# Patient Record
Sex: Female | Born: 1965 | Race: White | Hispanic: No | Marital: Married | Smoking: Former smoker
Health system: Southern US, Community
[De-identification: ages and names within clinical notes are randomized; demographics above are authoritative.]

## PROBLEM LIST (undated history)

## (undated) HISTORY — PX: CARPAL TUNNEL RELEASE: SHX101

## (undated) HISTORY — PX: OTHER SURGICAL HISTORY: SHX169

## (undated) HISTORY — PX: ABDOMINAL HYSTERECTOMY: SHX81

---

## 1998-11-06 ENCOUNTER — Other Ambulatory Visit: Admission: RE | Admit: 1998-11-06 | Discharge: 1998-11-06 | Payer: Self-pay | Admitting: Obstetrics and Gynecology

## 2000-10-24 ENCOUNTER — Other Ambulatory Visit: Admission: RE | Admit: 2000-10-24 | Discharge: 2000-10-24 | Payer: Self-pay | Admitting: Obstetrics and Gynecology

## 2005-11-16 ENCOUNTER — Other Ambulatory Visit: Admission: RE | Admit: 2005-11-16 | Discharge: 2005-11-16 | Payer: Self-pay | Admitting: Obstetrics and Gynecology

## 2011-11-05 ENCOUNTER — Other Ambulatory Visit: Payer: Self-pay | Admitting: Obstetrics and Gynecology

## 2011-11-05 DIAGNOSIS — R928 Other abnormal and inconclusive findings on diagnostic imaging of breast: Secondary | ICD-10-CM

## 2011-11-12 ENCOUNTER — Ambulatory Visit
Admission: RE | Admit: 2011-11-12 | Discharge: 2011-11-12 | Disposition: A | Discharge status: Home / Self Care | Payer: BC Managed Care – PPO | Source: Ambulatory Visit | Attending: Obstetrics and Gynecology | Admitting: Obstetrics and Gynecology

## 2011-11-12 ENCOUNTER — Other Ambulatory Visit: Payer: Self-pay | Admitting: Obstetrics and Gynecology

## 2011-11-12 DIAGNOSIS — R928 Other abnormal and inconclusive findings on diagnostic imaging of breast: Secondary | ICD-10-CM

## 2013-05-10 ENCOUNTER — Other Ambulatory Visit: Payer: Self-pay

## 2013-05-11 ENCOUNTER — Other Ambulatory Visit: Payer: Self-pay

## 2013-05-11 DIAGNOSIS — Z1231 Encounter for screening mammogram for malignant neoplasm of breast: Secondary | ICD-10-CM

## 2013-05-28 ENCOUNTER — Ambulatory Visit
Admission: RE | Admit: 2013-05-28 | Discharge: 2013-05-28 | Disposition: A | Discharge status: Home / Self Care | Payer: BC Managed Care – PPO | Source: Ambulatory Visit

## 2013-05-28 DIAGNOSIS — Z1231 Encounter for screening mammogram for malignant neoplasm of breast: Secondary | ICD-10-CM

## 2018-07-14 ENCOUNTER — Other Ambulatory Visit: Payer: Self-pay | Admitting: Family Medicine

## 2018-07-14 DIAGNOSIS — Z1231 Encounter for screening mammogram for malignant neoplasm of breast: Secondary | ICD-10-CM

## 2018-07-27 ENCOUNTER — Encounter: Payer: Self-pay | Admitting: *Deleted

## 2018-08-04 ENCOUNTER — Ambulatory Visit
Admission: RE | Admit: 2018-08-04 | Discharge: 2018-08-04 | Disposition: A | Discharge status: Home / Self Care | Payer: BC Managed Care – PPO | Source: Ambulatory Visit | Attending: Family Medicine | Admitting: Family Medicine

## 2018-08-04 DIAGNOSIS — Z1231 Encounter for screening mammogram for malignant neoplasm of breast: Secondary | ICD-10-CM

## 2019-08-09 ENCOUNTER — Other Ambulatory Visit: Payer: Self-pay | Admitting: Family Medicine

## 2019-08-09 DIAGNOSIS — R1084 Generalized abdominal pain: Secondary | ICD-10-CM

## 2019-08-15 ENCOUNTER — Other Ambulatory Visit: Payer: Self-pay

## 2019-08-15 ENCOUNTER — Ambulatory Visit
Admission: RE | Admit: 2019-08-15 | Discharge: 2019-08-15 | Disposition: A | Discharge status: Home / Self Care | Payer: BC Managed Care – PPO | Source: Ambulatory Visit | Attending: Family Medicine | Admitting: Family Medicine

## 2019-08-15 DIAGNOSIS — R1084 Generalized abdominal pain: Secondary | ICD-10-CM | POA: Diagnosis not present

## 2019-08-21 ENCOUNTER — Other Ambulatory Visit: Payer: Self-pay

## 2019-08-21 ENCOUNTER — Ambulatory Visit (INDEPENDENT_AMBULATORY_CARE_PROVIDER_SITE_OTHER): Payer: BC Managed Care – PPO | Admitting: Cardiology

## 2019-08-21 ENCOUNTER — Encounter: Payer: Self-pay | Admitting: Cardiology

## 2019-08-21 VITALS — BP 120/82 | HR 66 | Temp 97.0°F | Ht 66.0 in | Wt 171.8 lb

## 2019-08-21 DIAGNOSIS — I77819 Aortic ectasia, unspecified site: Secondary | ICD-10-CM

## 2019-08-21 DIAGNOSIS — Z01810 Encounter for preprocedural cardiovascular examination: Secondary | ICD-10-CM

## 2019-08-21 NOTE — Patient Instructions (Addendum)
Medication Instructions:  Your physician recommends that you continue on your current medications as directed. Please refer to the Current Medication list given to you today.  *If you need a refill on your cardiac medications before your next appointment, please call your pharmacy*  Lab Work: Your physician recommends that you return for lab work in: prior to the [place].   - Please go to the [facility]. You will check in at the front desk to the right as you walk into the atrium. Valet Parking is offered if needed. - No appointment needed. You may go any day between 7 am and 6 pm.  If you have labs (blood work) drawn today and your tests are completely normal, you will receive your results only by: Marland Kitchen MyChart Message (if you have MyChart) OR . A paper copy in the mail If you have any lab test that is abnormal or we need to change your treatment, we will call you to review the results.  Testing/Procedures: Non-Cardiac CT Angiography (CTA) of chest, abdomen, and pelvis, is a special type of CT scan that uses a computer to produce multi-dimensional views of major blood vessels throughout the body. In CT angiography, a contrast material is injected through an IV to help visualize the blood vessels DATE/TIME: _____Thursday, August 30, 2019 arrive at 12:45 pm ____________ LOCATION:  ____[facility] _____ INSTRUCTIONS: __Liquids only 4 hours prior to procedure______    Follow-Up: At [facility], you and your health needs are our priority.  As part of our continuing mission to provide you with exceptional heart care, we have created designated Provider Care Teams.  These Care Teams include your primary Cardiologist (physician) and Advanced Practice Providers (APPs -  Physician Assistants and Nurse Practitioners) who all work together to provide you with the care you need, when you need it.  Your next appointment:   1-2 months.  The format for your next appointment:   In  Person  Provider:    You may see Kate Sable, MD or one of the following Advanced Practice Providers on your designated Care Team:    Murray Hodgkins, NP  Christell Faith, PA-C  Marrianne Mood, PA-C

## 2019-08-21 NOTE — Progress Notes (Signed)
Cardiology Office Note:    Date:  08/21/2019   ID:  Ashley Crosby, DOB May 30, 1966, MRN [id number]  PCP:  Ranae Plumber, [place]  Cardiologist:  Kate Sable, MD  Electrophysiologist:  None   Referring MD: Ranae Plumber, [place]   Chief Complaint  Patient presents with  . New Patient (Initial Visit)    Dilated abdominal aorta; Patient reports intermittent bilat hand swelling; Meds reviewed verbally with patient    History of Present Illness:    Ashley Crosby is a 53 y.o. female with a hx of dilated abdominal aorta, smoker x34+ years who presents due to aortic disease.  Patient went to see her primary care provider due to abdominal pain.  An abdominal ultrasound was ordered to evaluate gallstone disease.  The abdominal ultrasound revealed an ectatic abdominal aorta measuring 2.5 cm.  Patient denies any history of aortic disease in herself or her family.  She has smoked cigarettes for 34 years, tried to quit but that did not work so she started doing e-cigarettes.  She states her breathing and frequent bronchitis has improved on the e-cigarettes.  She denies chest pain or shortness of breath at rest or with ambulation.  History reviewed. No pertinent past medical history.  Past Surgical History:  Procedure Laterality Date  . ABDOMINAL HYSTERECTOMY    . CARPAL TUNNEL RELEASE Right   . foot surgery Right     Current Medications: Current Meds  Medication Sig  . traMADol (ULTRAM) 50 MG tablet Take by mouth every 6 (six) hours as needed.     Allergies:   Patient has no known allergies.   Social History   Socioeconomic History  . Marital status: Married    Spouse name: Not on file  . Number of children: Not on file  . Years of education: Not on file  . Highest education level: Not on file  Occupational History  . Not on file  Social Needs  . Financial resource strain: Not on file  . Food insecurity    Worry: Not on file    Inability: Not on file  . Transportation needs   Medical: Not on file    Non-medical: Not on file  Tobacco Use  . Smoking status: Former Smoker    Years: 30.00    Types: Cigarettes  . Smokeless tobacco: Never Used  Substance and Sexual Activity  . Alcohol use: Not Currently  . Drug use: Never  . Sexual activity: Not on file  Lifestyle  . Physical activity    Days per week: Not on file    Minutes per session: Not on file  . Stress: Not on file  Relationships  . Social Herbalist on phone: Not on file    Gets together: Not on file    Attends religious service: Not on file    Active member of club or organization: Not on file    Attends meetings of clubs or organizations: Not on file    Relationship status: Not on file  Other Topics Concern  . Not on file  Social History Narrative  . Not on file     Family History: The patient's family history is not on file. She was adopted.  ROS:   Please see the history of present illness.     All other systems reviewed and are negative.  EKGs/Labs/Other Studies Reviewed:    The following studies were reviewed today:  Abdominal ultrasound dated 08/15/2019 IMPRESSION: 1. Negative for gallstones. Distal common duct diameter  borderline to slightly enlarged measuring 6.9 mm maximum. Suggest correlation with LFTs. If ductal obstruction is suspected, further evaluation with MRCP could be obtained. 2. Small cyst in the liver and kidney 3. Aortic diameter of 2.5 cm. Ectatic abdominal aorta at risk for aneurysm development. Recommend followup by ultrasound in 5 years. This recommendation follows ACR consensus guidelines: White Paper of the ACR Incidental Findings Committee II on Vascular Findings. J Am Coll Radiol 2013; 10:789-794.  EKG:  EKG is  ordered today.  The ekg ordered today demonstrates normal sinus rhythm, normal ECG.  Recent Labs: No results found for requested labs within last 8760 hours.  Recent Lipid Panel No results found for: CHOL, TRIG, HDL, CHOLHDL,  VLDL, LDLCALC, LDLDIRECT  Physical Exam:    VS:  BP 120/82 (BP Location: Right Arm, Patient Position: Sitting, Cuff Size: Normal)   Pulse 66   Temp (!) 97 F (36.1 C)   Ht 5\' 6"  (1.676 m)   Wt 171 lb 12 oz (77.9 kg)   SpO2 99%   BMI 27.72 kg/m     Wt Readings from Last 3 Encounters:  08/21/19 171 lb 12 oz (77.9 kg)     GEN:  Well nourished, well developed in no acute distress HEENT: Normal NECK: No JVD; No carotid bruits LYMPHATICS: No lymphadenopathy CARDIAC: RRR, no murmurs, rubs, gallops RESPIRATORY:  Clear to auscultation without rales, wheezing or rhonchi  ABDOMEN: Soft, non-tender, non-distended MUSCULOSKELETAL:  No edema; No deformity  SKIN: Warm and dry NEUROLOGIC:  Alert and oriented x 3 PSYCHIATRIC:  Normal affect   ASSESSMENT:   Patient with risk factors of smoking found to have an ectatic abdominal aorta.  Risk factors, pathology for aortic dilatation explained to patient.  Management options also explained.  1. Aortic dilatation (HCC)   2. smoking    PLAN:    1. We will get a CT angio of her chest abdomen to evaluate the entire aorta to see if there is dilatation in any of the other aortic segments. 2. Smoking cessation advised.  Total encounter time more than 45 minutes  Greater than 50% was spent in counseling and coordination of care with the patient   Medication Adjustments/Labs and Tests Ordered: Current medicines are reviewed at length with the patient today.  Concerns regarding medicines are outlined above.  Orders Placed This Encounter  Procedures  . CT ANGIO CHEST AORTA W &/OR WO CONTRAST  . CT ANGIO ABDOMEN PELVIS  W &/OR WO CONTRAST  . Basic metabolic panel  . EKG 12-Lead   No orders of the defined types were placed in this encounter.   There are no Patient Instructions on file for this visit.   Signed, Kate Sable, MD  08/21/2019 4:48 PM    Standard

## 2019-08-24 ENCOUNTER — Other Ambulatory Visit: Payer: Self-pay

## 2019-08-24 ENCOUNTER — Other Ambulatory Visit
Admission: RE | Admit: 2019-08-24 | Discharge: 2019-08-24 | Disposition: A | Discharge status: Home / Self Care | Payer: BC Managed Care – PPO | Source: Ambulatory Visit | Attending: Cardiology | Admitting: Cardiology

## 2019-08-24 DIAGNOSIS — I77819 Aortic ectasia, unspecified site: Secondary | ICD-10-CM

## 2019-08-24 DIAGNOSIS — Z01810 Encounter for preprocedural cardiovascular examination: Secondary | ICD-10-CM

## 2019-08-24 LAB — BASIC METABOLIC PANEL
Anion gap: 8 (ref 5–15)
BUN: 15 mg/dL (ref 6–20)
CO2: 28 mmol/L (ref 22–32)
Calcium: 9.2 mg/dL (ref 8.9–10.3)
Chloride: 103 mmol/L (ref 98–111)
Creatinine, Ser: 0.66 mg/dL (ref 0.44–1.00)
GFR calc Af Amer: >60 mL/min (ref >60)
GFR calc non Af Amer: >60 mL/min (ref >60)
Glucose, Bld: 87 mg/dL (ref 70–99)
Potassium: 4.8 mmol/L (ref 3.5–5.1)
Sodium: 139 mmol/L (ref 135–145)

## 2019-08-30 ENCOUNTER — Other Ambulatory Visit: Payer: Self-pay

## 2019-08-30 ENCOUNTER — Ambulatory Visit
Admission: RE | Admit: 2019-08-30 | Discharge: 2019-08-30 | Disposition: A | Discharge status: Home / Self Care | Payer: BC Managed Care – PPO | Source: Ambulatory Visit | Attending: Cardiology | Admitting: Cardiology

## 2019-08-30 DIAGNOSIS — I77819 Aortic ectasia, unspecified site: Secondary | ICD-10-CM

## 2019-08-30 MED ORDER — IOHEXOL 350 MG/ML SOLN
100.0000 mL | Freq: Once | INTRAVENOUS | Status: AC | PRN
  Administered 2019-08-30: 100 mL via INTRAVENOUS

## 2019-08-31 ENCOUNTER — Other Ambulatory Visit: Payer: Self-pay | Admitting: Family Medicine

## 2019-08-31 DIAGNOSIS — N63 Unspecified lump in unspecified breast: Secondary | ICD-10-CM

## 2019-09-11 ENCOUNTER — Ambulatory Visit
Admission: RE | Admit: 2019-09-11 | Discharge: 2019-09-11 | Disposition: A | Discharge status: Home / Self Care | Payer: BC Managed Care – PPO | Source: Ambulatory Visit | Attending: Family Medicine | Admitting: Family Medicine

## 2019-09-11 ENCOUNTER — Other Ambulatory Visit: Payer: Self-pay

## 2019-09-11 ENCOUNTER — Ambulatory Visit: Payer: BC Managed Care – PPO

## 2019-09-11 DIAGNOSIS — I8290 Acute embolism and thrombosis of unspecified vein: Secondary | ICD-10-CM | POA: Insufficient documentation

## 2019-09-11 DIAGNOSIS — N63 Unspecified lump in unspecified breast: Secondary | ICD-10-CM

## 2019-09-12 ENCOUNTER — Encounter: Payer: Self-pay | Admitting: Gastroenterology

## 2019-09-12 ENCOUNTER — Ambulatory Visit (INDEPENDENT_AMBULATORY_CARE_PROVIDER_SITE_OTHER): Payer: BC Managed Care – PPO | Admitting: Gastroenterology

## 2019-09-12 ENCOUNTER — Other Ambulatory Visit: Payer: Self-pay

## 2019-09-12 VITALS — BP 126/81 | HR 76 | Temp 98.1°F | Wt 168.5 lb

## 2019-09-12 DIAGNOSIS — R109 Unspecified abdominal pain: Secondary | ICD-10-CM

## 2019-09-12 DIAGNOSIS — K831 Obstruction of bile duct: Secondary | ICD-10-CM | POA: Diagnosis not present

## 2019-09-12 NOTE — Progress Notes (Signed)
Ashley Crosby [address]  [place]  [place], [place] [address]  Main: [phone]  Fax: [phone]   Gastroenterology Consultation  Referring Provider:     Marguerita Merles, MD Primary Care Physician:  Ranae Plumber, [place] Reason for Consultation:     Abdominal pain        HPI:    Chief Complaint  Patient presents with   New Patient (Initial Visit)   Abdominal Pain    Patient has RUQ pain and it radiates to her back. Patient states the pain comes and goes. Patient has had nausea constipation and diarrhea     Ashley Crosby is a 53 y.o. y/o female referred for consultation & management  by Dr. Ranae Plumber, [place].  Patient reports right upper quadrant abdominal pain ongoing for 1 to 2 months.  Constant, does not worsen with meals.  5/10, not associated with nausea or vomiting.  No weight loss.  Has had some changes in bowel movements, but no blood in stool.  No diarrhea.  No melena.  No prior EGD or colonoscopy.  No family history of colon cancer.  The only lab that I have available is a recent BMP that was normal.  Right upper quadrant ultrasound negative for gallstones.  Reported distal CBD, borderline enlarged to 6.9 mm.  Is undergoing work-up with cardiology for aortic dilation.  History reviewed. No pertinent past medical history.  Past Surgical History:  Procedure Laterality Date   ABDOMINAL HYSTERECTOMY     CARPAL TUNNEL RELEASE Right    foot surgery Right     Prior to Admission medications   Medication Sig Start Date End Date Taking? Authorizing Provider  cyclobenzaprine (FLEXERIL) 5 MG tablet  09/07/19  Yes [provider]    Family History  Adopted: Yes     Social History   Tobacco Use   Smoking status: Former Smoker    Years: 30.00    Types: Cigarettes   Smokeless tobacco: Never Used  Substance Use Topics   Alcohol use: Not Currently   Drug use: Never    Allergies as of 09/12/2019   (No Known Allergies)     Review of Systems:    All systems reviewed and negative except where noted in HPI.   Physical Exam:  BP 126/81 (BP Location: Left Arm, Patient Position: Sitting, Cuff Size: Normal)    Pulse 76    Temp 98.1 F (36.7 C) (Oral)    Wt 168 lb 8 oz (76.4 kg)    BMI 27.20 kg/m  No LMP recorded. Psych:  Alert and cooperative. Normal mood and affect. General:   Alert,  Well-developed, well-nourished, pleasant and cooperative in NAD Head:  Normocephalic and atraumatic. Eyes:  Sclera clear, no icterus.   Conjunctiva pink. Ears:  Normal auditory acuity. Nose:  No deformity, discharge, or lesions. Mouth:  No deformity or lesions,oropharynx pink & moist. Neck:  Supple; no masses or thyromegaly. Abdomen:  Normal bowel sounds.  No bruits.  Soft, non-tender and non-distended without masses, hepatosplenomegaly or hernias noted.  No guarding or rebound tenderness.    Msk:  Symmetrical without gross deformities. Good, equal movement & strength bilaterally. Pulses:  Normal pulses noted. Extremities:  No clubbing or edema.  No cyanosis. Neurologic:  Alert and oriented x3;  grossly normal neurologically. Skin:  Intact without significant lesions or rashes. No jaundice. Lymph Nodes:  No significant cervical adenopathy. Psych:  Alert and cooperative. Normal mood and affect.   Labs: CBC No results  found for: WBC, RBC, HGB, HCT, PLT, MCV, MCH, MCHC, RDW, LYMPHSABS, MONOABS, EOSABS, BASOSABS CMP     Component Value Date/Time   NA 139 08/24/2019 1401   K 4.8 08/24/2019 1401   CL 103 08/24/2019 1401   CO2 28 08/24/2019 1401   GLUCOSE 87 08/24/2019 1401   BUN 15 08/24/2019 1401   CREATININE 0.66 08/24/2019 1401   CALCIUM 9.2 08/24/2019 1401   GFRNONAA >60 08/24/2019 1401   GFRAA >60 08/24/2019 1401    Imaging Studies: US Abdomen Complete  Result Date: 08/15/2019 CLINICAL DATA:  Right-sided abdominal pain EXAM: ABDOMEN ULTRASOUND COMPLETE COMPARISON:  None. FINDINGS: Gallbladder: No gallstones  or wall thickening visualized. No sonographic Murphy sign noted by sonographer. Common bile duct: Diameter: Measures up to 6.9 mm distally. Liver: Small cysts in the posterior right hepatic lobe measuring 9 mm. Within normal limits in parenchymal echogenicity. Portal vein is patent on color Doppler imaging with normal direction of blood flow towards the liver. IVC: No abnormality visualized. Pancreas: Visualized portion unremarkable. Spleen: Size and appearance within normal limits. Right Kidney: Length: 10.6 cm. Echogenicity within normal limits. No mass or hydronephrosis visualized. Left Kidney: Length: 10.4 cm. Cortical echogenicity within normal limits. No hydronephrosis. Cyst in the lower pole measuring 1.2 cm. Abdominal aorta: No aneurysm visualized. Maximum AP diameter of 2.5 cm Other findings: None. IMPRESSION: 1. Negative for gallstones. Distal common duct diameter borderline to slightly enlarged measuring 6.9 mm maximum. Suggest correlation with LFTs. If ductal obstruction is suspected, further evaluation with MRCP could be obtained. 2. Small cyst in the liver and kidney 3. Aortic diameter of 2.5 cm. Ectatic abdominal aorta at risk for aneurysm development. Recommend followup by ultrasound in 5 years. This recommendation follows ACR consensus guidelines: White Paper of the ACR Incidental Findings Committee II on Vascular Findings. J Am Coll Radiol 2013; 10:789-794. Electronically Signed   By: Donavan Foil M.D.   On: 08/15/2019 13:38   Mm Diag Breast Tomo Bilateral  Result Date: 09/11/2019 CLINICAL DATA:  53 year old female for further evaluation of possible RETROAREOLAR LEFT breast mass identified on recent CT. EXAM: DIGITAL DIAGNOSTIC BILATERAL MAMMOGRAM WITH CAD AND TOMO COMPARISON:  08/30/2019 CTA chest and mammograms dated 08/04/2018 and 05/28/2013. ACR Breast Density Category c: The breast tissue is heterogeneously dense, which may obscure small masses. FINDINGS: 2D/3D full field views of both  breasts demonstrate a 2 cm circumscribed oval mass within the RETROAREOLAR LEFT breast corresponding to the CT finding. This mass is unchanged from 2014 and considered benign. No suspicious mass, distortion or worrisome calcifications within either breast noted. Mammographic images were processed with CAD. IMPRESSION: 1. Benign mass in the RETROAREOLAR LEFT breast corresponding to the CT finding, unchanged from 2014 mammogram. 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 2. No mammographic evidence of breast malignancy. RECOMMENDATION: Bilateral screening mammogram in 1 year. I have discussed the findings and recommendations with the patient. If applicable, a reminder letter will be sent to the patient regarding the next appointment. BI-RADS CATEGORY  2: Benign. Electronically Signed   By: Margarette Canada M.D.   On: 09/11/2019 15:03   Ct Angio Chest Aorta W &/or Wo Contrast  Result Date: 08/30/2019 CLINICAL DATA:  Evaluate for aortic dilatation. EXAM: CT ANGIOGRAPHY CHEST, ABDOMEN AND PELVIS TECHNIQUE: Multidetector CT imaging through the chest, abdomen and pelvis was performed using the standard protocol during bolus administration of intravenous contrast. Multiplanar reconstructed images and MIPs were obtained and reviewed to evaluate the vascular anatomy. CONTRAST:  149mL OMNIPAQUE IOHEXOL 350  MG/ML SOLN COMPARISON:  Ultrasound 08/07/2019 FINDINGS: CTA CHEST FINDINGS Cardiovascular: Normal caliber of the ascending thoracic aorta measuring up to 2.9 cm. Typical three-vessel arch anatomy. Great vessels are patent. Proximal vertebral arteries are patent. Proximal descending thoracic aorta measures 2.2 cm. Negative for an aortic dissection. Distal descending thoracic aorta measures 2.1 cm. Main pulmonary arteries are patent. Heart size is normal. No significant pericardial fluid. Mediastinum/Nodes: Question a 1.1 cm low-density cyst nodule in the superior left thyroid lobe. No significant mediastinal or hilar  lymphadenopathy. No significant axillary lymph node enlargement. Focal nodularity in the breast tissue in the retroareolar region. This nodular tissue measures 2.0 x 1.2 x 1.8 cm. Lungs/Pleura: Scarring at both lung apices. Trachea and mainstem bronchi are patent. No large pleural effusions. Focal thickening along the right major fissure on sequence 505, image 123 measures 3 mm. No significant airspace disease or consolidation in the lungs. Musculoskeletal: No acute bone abnormality. Review of the MIP images confirms the above findings. CTA ABDOMEN AND PELVIS FINDINGS VASCULAR Aorta: Mild atherosclerotic disease in the infrarenal abdominal aorta. Negative for aneurysm or dissection. Aorta at the hiatus measures up to 2.2 cm. Celiac: Patent without evidence of aneurysm, dissection, vasculitis or significant stenosis. Question mild stenosis in the proximal aspect. SMA: Patent without evidence of aneurysm, dissection, vasculitis or significant stenosis. Renals: Both renal arteries are patent without evidence of aneurysm, dissection, vasculitis, fibromuscular dysplasia or significant stenosis. Right renal artery has an early bifurcation. IMA: IMA is patent. Inflow: Patent without evidence of aneurysm, dissection, vasculitis or significant stenosis. Veins: Normal appearance of the IVC and portal venous system on the delayed images. Review of the MIP images confirms the above findings. NON-VASCULAR Hepatobiliary: Normal appearance of the gallbladder. 9 mm low-density along the posterior aspect of the right hepatic lobe on sequence 54, image 163 probably represents a cyst. No biliary dilatation. Pancreas: Scattered areas of focal fat involving the pancreas. No pancreatic inflammation or duct dilatation. Spleen: Normal in size without focal abnormality. Adrenals/Urinary Tract: Adrenal tissue is within normal limits. 1.2 cm cyst in left kidney lower pole. No suspicious renal lesions. No hydronephrosis. Urinary bladder is  unremarkable. Stomach/Bowel: Stomach is within normal limits. Appendix appears normal. No evidence of bowel wall thickening, distention, or inflammatory changes. Lymphatic: No abdominopelvic lymphadenopathy. Reproductive: Status post hysterectomy. No adnexal masses. Other: Negative for ascites.  Negative for free air. Musculoskeletal: No acute abnormality. Review of the MIP images confirms the above findings. IMPRESSION: 1. Negative for an aortic aneurysm. 2.  Aortic Atherosclerosis (ICD10-I70.0). 3. **An incidental finding of potential clinical significance has been found. Indeterminate nodular left breast tissue. Recommend correlation with mammography.** 4. No acute abnormality. 5. Focal nodularity along the right major fissure. No follow-up needed if patient is low-risk. Non-contrast chest CT can be considered in 12 months if patient is high-risk. This recommendation follows the consensus statement: Guidelines for Management of Incidental Pulmonary Nodules Detected on CT Images: From the Fleischner Society 2017; Radiology 2017; 284:228-243. These results will be called to the ordering clinician or representative by the Radiologist Assistant, and communication documented in the PACS or zVision Dashboard. Electronically Signed   By: Markus Daft M.D.   On: 08/30/2019 14:55   Ct Angio Abdomen Pelvis  W &/or Wo Contrast  Result Date: 08/30/2019 CLINICAL DATA:  Evaluate for aortic dilatation. EXAM: CT ANGIOGRAPHY CHEST, ABDOMEN AND PELVIS TECHNIQUE: Multidetector CT imaging through the chest, abdomen and pelvis was performed using the standard protocol during bolus administration of intravenous contrast. Multiplanar  reconstructed images and MIPs were obtained and reviewed to evaluate the vascular anatomy. CONTRAST:  17mL OMNIPAQUE IOHEXOL 350 MG/ML SOLN COMPARISON:  Ultrasound 08/07/2019 FINDINGS: CTA CHEST FINDINGS Cardiovascular: Normal caliber of the ascending thoracic aorta measuring up to 2.9 cm. Typical  three-vessel arch anatomy. Great vessels are patent. Proximal vertebral arteries are patent. Proximal descending thoracic aorta measures 2.2 cm. Negative for an aortic dissection. Distal descending thoracic aorta measures 2.1 cm. Main pulmonary arteries are patent. Heart size is normal. No significant pericardial fluid. Mediastinum/Nodes: Question a 1.1 cm low-density cyst nodule in the superior left thyroid lobe. No significant mediastinal or hilar lymphadenopathy. No significant axillary lymph node enlargement. Focal nodularity in the breast tissue in the retroareolar region. This nodular tissue measures 2.0 x 1.2 x 1.8 cm. Lungs/Pleura: Scarring at both lung apices. Trachea and mainstem bronchi are patent. No large pleural effusions. Focal thickening along the right major fissure on sequence 505, image 123 measures 3 mm. No significant airspace disease or consolidation in the lungs. Musculoskeletal: No acute bone abnormality. Review of the MIP images confirms the above findings. CTA ABDOMEN AND PELVIS FINDINGS VASCULAR Aorta: Mild atherosclerotic disease in the infrarenal abdominal aorta. Negative for aneurysm or dissection. Aorta at the hiatus measures up to 2.2 cm. Celiac: Patent without evidence of aneurysm, dissection, vasculitis or significant stenosis. Question mild stenosis in the proximal aspect. SMA: Patent without evidence of aneurysm, dissection, vasculitis or significant stenosis. Renals: Both renal arteries are patent without evidence of aneurysm, dissection, vasculitis, fibromuscular dysplasia or significant stenosis. Right renal artery has an early bifurcation. IMA: IMA is patent. Inflow: Patent without evidence of aneurysm, dissection, vasculitis or significant stenosis. Veins: Normal appearance of the IVC and portal venous system on the delayed images. Review of the MIP images confirms the above findings. NON-VASCULAR Hepatobiliary: Normal appearance of the gallbladder. 9 mm low-density along the  posterior aspect of the right hepatic lobe on sequence 54, image 163 probably represents a cyst. No biliary dilatation. Pancreas: Scattered areas of focal fat involving the pancreas. No pancreatic inflammation or duct dilatation. Spleen: Normal in size without focal abnormality. Adrenals/Urinary Tract: Adrenal tissue is within normal limits. 1.2 cm cyst in left kidney lower pole. No suspicious renal lesions. No hydronephrosis. Urinary bladder is unremarkable. Stomach/Bowel: Stomach is within normal limits. Appendix appears normal. No evidence of bowel wall thickening, distention, or inflammatory changes. Lymphatic: No abdominopelvic lymphadenopathy. Reproductive: Status post hysterectomy. No adnexal masses. Other: Negative for ascites.  Negative for free air. Musculoskeletal: No acute abnormality. Review of the MIP images confirms the above findings. IMPRESSION: 1. Negative for an aortic aneurysm. 2.  Aortic Atherosclerosis (ICD10-I70.0). 3. **An incidental finding of potential clinical significance has been found. Indeterminate nodular left breast tissue. Recommend correlation with mammography.** 4. No acute abnormality. 5. Focal nodularity along the right major fissure. No follow-up needed if patient is low-risk. Non-contrast chest CT can be considered in 12 months if patient is high-risk. This recommendation follows the consensus statement: Guidelines for Management of Incidental Pulmonary Nodules Detected on CT Images: From the Fleischner Society 2017; Radiology 2017; 284:228-243. These results will be called to the ordering clinician or representative by the Radiologist Assistant, and communication documented in the PACS or zVision Dashboard. Electronically Signed   By: Markus Daft M.D.   On: 08/30/2019 14:55    Assessment and Plan:   Braileigh Halprin is a 53 y.o. y/o female has been referred for abdominal pain in the right upper quadrant  Given the location of  the pain and ultrasound reporting mild  dilation of the common duct, will obtain MRCP and correlate with liver enzymes  If these are normal, can proceed with screening colonoscopy, along with EGD as well for abdominal pain  However, will need cardiac clearance due to aortic dilation work-up pending  Patient takes NSAIDs about 3-4 times a week and this can cause small ulcers abdominal pain.  No alarm symptoms present to indicate urgent EGD.  Advised against NSAID use also.  Follow-up closely in 3 to 4 weeks    Dr Ashley Crosby  Speech recognition software was used to dictate the above note.

## 2019-09-12 NOTE — Patient Instructions (Signed)
Your MRCP is scheduled for 09/27/2019 at 1pm. Arrive a [facility] at 12:30. Nothing to eat or drank 4 hours before.

## 2019-09-13 LAB — COMPREHENSIVE METABOLIC PANEL
ALT: 17 IU/L (ref 0–32)
AST: 16 IU/L (ref 0–40)
Albumin/Globulin Ratio: 1.8 (ref 1.2–2.2)
Albumin: 4.4 g/dL (ref 3.8–4.9)
Alkaline Phosphatase: 72 IU/L (ref 39–117)
BUN/Creatinine Ratio: 17 (ref 9–23)
BUN: 13 mg/dL (ref 6–24)
Bilirubin Total: 0.2 mg/dL (ref 0.0–1.2)
CO2: 21 mmol/L (ref 20–29)
Calcium: 9.4 mg/dL (ref 8.7–10.2)
Chloride: 103 mmol/L (ref 96–106)
Creatinine, Ser: 0.78 mg/dL (ref 0.57–1.00)
GFR calc Af Amer: 101 mL/min/{1.73_m2} (ref >59)
GFR calc non Af Amer: 88 mL/min/{1.73_m2} (ref >59)
Globulin, Total: 2.5 g/dL (ref 1.5–4.5)
Glucose: 105 mg/dL — ABNORMAL HIGH (ref 65–99)
Potassium: 4.4 mmol/L (ref 3.5–5.2)
Sodium: 141 mmol/L (ref 134–144)
Total Protein: 6.9 g/dL (ref 6.0–8.5)

## 2019-09-17 ENCOUNTER — Ambulatory Visit: Payer: BC Managed Care – PPO | Admitting: Cardiology

## 2019-09-17 ENCOUNTER — Other Ambulatory Visit: Payer: Self-pay

## 2019-09-27 ENCOUNTER — Other Ambulatory Visit: Payer: Self-pay

## 2019-09-27 ENCOUNTER — Ambulatory Visit
Admission: RE | Admit: 2019-09-27 | Discharge: 2019-09-27 | Disposition: A | Discharge status: Home / Self Care | Payer: BC Managed Care – PPO | Source: Ambulatory Visit | Attending: Gastroenterology | Admitting: Gastroenterology

## 2019-09-27 DIAGNOSIS — K831 Obstruction of bile duct: Secondary | ICD-10-CM | POA: Diagnosis present

## 2019-10-04 ENCOUNTER — Telehealth: Payer: Self-pay | Admitting: Cardiology

## 2019-10-04 ENCOUNTER — Other Ambulatory Visit: Payer: Self-pay

## 2019-10-04 ENCOUNTER — Encounter: Payer: Self-pay | Admitting: Gastroenterology

## 2019-10-04 ENCOUNTER — Telehealth: Payer: Self-pay

## 2019-10-04 ENCOUNTER — Ambulatory Visit (INDEPENDENT_AMBULATORY_CARE_PROVIDER_SITE_OTHER): Payer: BC Managed Care – PPO | Admitting: Gastroenterology

## 2019-10-04 DIAGNOSIS — Z09 Encounter for follow-up examination after completed treatment for conditions other than malignant neoplasm: Secondary | ICD-10-CM | POA: Diagnosis not present

## 2019-10-04 DIAGNOSIS — R109 Unspecified abdominal pain: Secondary | ICD-10-CM

## 2019-10-04 DIAGNOSIS — Z8719 Personal history of other diseases of the digestive system: Secondary | ICD-10-CM | POA: Diagnosis not present

## 2019-10-04 DIAGNOSIS — Z1211 Encounter for screening for malignant neoplasm of colon: Secondary | ICD-10-CM

## 2019-10-04 MED ORDER — BISACODYL EC 5 MG PO TBEC: DELAYED_RELEASE_TABLET | ORAL | 0 refills | Status: AC

## 2019-10-04 MED ORDER — NA SULFATE-K SULFATE-MG SULF 17.5-3.13-1.6 GM/177ML PO SOLN: 354.0000 mL | Freq: Once | ORAL | 0 refills | Status: AC

## 2019-10-04 NOTE — Progress Notes (Signed)
Ashley Antigua, MD [address]  [place]  [place], [place] [address]  Main: [phone]  Fax: [phone]   Primary Care Physician: Ranae Plumber, [place]  Virtual Visit via Video Note  I connected with patient on 10/04/19 at  3:30 PM EST by video (using [email or web address]) and verified that I am speaking with the correct person using two identifiers.   I discussed the limitations, risks, security and privacy concerns of performing an evaluation and management service by video and the availability of in person appointments. I also discussed with the patient that there may be a patient responsible charge related to this service. The patient expressed understanding and agreed to proceed.  Location of Patient: Home Location of Provider: Home Persons involved: Patient and provider only (Nursing staff checked in patient via phone but were not physically involved in the video interaction - see their notes)   History of Present Illness: Chief Complaint  Patient presents with  . Abdominal Pain    Patient states the pain is better. Has some diarrhea and constipation sometimes     HPI: Ashley Crosby is a 53 y.o. female previously seen for abdominal pain here for follow-up.  Patient now states her abdominal pain has completely resolved and attributes this to completely discontinuing orange juice.  Was drinking 2-3 times a day but has stopped completely this has relieved her symptoms.  However, does report that after eating she has to immediately go to the bathroom to have a bowel movement.  But no blood in stool.  No weight loss.  Current Outpatient Medications  Medication Sig Dispense Refill  . cyclobenzaprine (FLEXERIL) 5 MG tablet      No current facility-administered medications for this visit.    Allergies as of 10/04/2019  . (No Known Allergies)    Review of Systems:    All systems reviewed and negative except where noted in HPI.    Observations/Objective:  Labs: CMP     Component Value Date/Time   NA 141 09/12/2019 1547   K 4.4 09/12/2019 1547   CL 103 09/12/2019 1547   CO2 21 09/12/2019 1547   GLUCOSE 105 (H) 09/12/2019 1547   GLUCOSE 87 08/24/2019 1401   BUN 13 09/12/2019 1547   CREATININE 0.78 09/12/2019 1547   CALCIUM 9.4 09/12/2019 1547   PROT 6.9 09/12/2019 1547   ALBUMIN 4.4 09/12/2019 1547   AST 16 09/12/2019 1547   ALT 17 09/12/2019 1547   ALKPHOS 72 09/12/2019 1547   BILITOT 0.2 09/12/2019 1547   GFRNONAA 88 09/12/2019 1547   GFRAA 101 09/12/2019 1547   No results found for: WBC, HGB, HCT, MCV, PLT  Imaging Studies: MR ABDOMEN MRCP WO CONTRAST  Result Date: 09/27/2019 CLINICAL DATA:  Previous abdominal pain with mildly enlarged common bile duct on prior ultrasound, for further investigation EXAM: MRI ABDOMEN WITHOUT CONTRAST  (INCLUDING MRCP) TECHNIQUE: Multiplanar multisequence MR imaging of the abdomen was performed. Heavily T2-weighted images of the biliary and pancreatic ducts were obtained, and three-dimensional MRCP images were rendered by post processing. COMPARISON:  Ultrasound 08/15/2019 and CT abdomen 08/30/2019 FINDINGS: Lower chest: Unremarkable Hepatobiliary: 0.9 by 0.6 cm fluid signal intensity lesion posteriorly in the right hepatic lobe on image 16/4, stable from 08/30/2019 and most compatible with a cyst. No current intrahepatic or extrahepatic biliary dilatation. Currently the common bile duct measures up to 0.4 cm in diameter. No abnormal filling defect in the gallbladder. No significant abnormal restriction of diffusion in the liver. Pancreas:  Unremarkable Spleen:  Unremarkable Adrenals/Urinary Tract: Sharply defined homogeneous fluid signal intensity lesion of the left kidney lower pole measures 1.2 by 0.6 cm, noncontrast MRI appearance favors a cyst. Adrenal glands unremarkable. Stomach/Bowel: Unremarkable Vascular/Lymphatic:  Aortoiliac atherosclerotic vascular disease. Other:   No supplemental non-categorized findings. Musculoskeletal: Unremarkable IMPRESSION: 1. No current biliary dilatation or biliary abnormality is evident on noncontrast MRCP. 2. Small hepatic cyst and small left renal cyst. 3.  Aortic Atherosclerosis (ICD10-I70.0). Electronically Signed   By: Van Clines M.D.   On: 09/27/2019 14:36   MM DIAG BREAST TOMO BILATERAL  Result Date: 09/11/2019 CLINICAL DATA:  53 year old female for further evaluation of possible RETROAREOLAR LEFT breast mass identified on recent CT. EXAM: DIGITAL DIAGNOSTIC BILATERAL MAMMOGRAM WITH CAD AND TOMO COMPARISON:  08/30/2019 CTA chest and mammograms dated 08/04/2018 and 05/28/2013. ACR Breast Density Category c: The breast tissue is heterogeneously dense, which may obscure small masses. FINDINGS: 2D/3D full field views of both breasts demonstrate a 2 cm circumscribed oval mass within the RETROAREOLAR LEFT breast corresponding to the CT finding. This mass is unchanged from 2014 and considered benign. No suspicious mass, distortion or worrisome calcifications within either breast noted. Mammographic images were processed with CAD. IMPRESSION: 1. Benign mass in the RETROAREOLAR LEFT breast corresponding to the CT finding, unchanged from 2014 mammogram. 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 2. No mammographic evidence of breast malignancy. RECOMMENDATION: Bilateral screening mammogram in 1 year. I have discussed the findings and recommendations with the patient. If applicable, a reminder letter will be sent to the patient regarding the next appointment. BI-RADS CATEGORY  2: Benign. Electronically Signed   By: Margarette Canada M.D.   On: 09/11/2019 15:03    Assessment and Plan:   Ashley Crosby is a 53 y.o. y/o female the previous history of abdominal pain which is now resolved, but reporting need for having a bowel movement right after eating  Assessment and Plan: Since upper GI symptoms are resolved patient does not want to  go an upper endoscopy at this time.    She is agreeable to screening colonoscopy with, which will also allow [place] to evaluate for any other underlying lesions and remove any polyps found  If symptoms change from now to the colonoscopy, I have asked her to let us know and an upper endoscopy can be done if symptoms return  No alarm symptoms present  We will send cardiac clearance request to Dr. Mylo Red  Follow Up Instructions: 6-12 months   I discussed the assessment and treatment plan with the patient. The patient was provided an opportunity to ask questions and all were answered. The patient agreed with the plan and demonstrated an understanding of the instructions.   The patient was advised to call back or seek an in-person evaluation if the symptoms worsen or if the condition fails to improve as anticipated.  I provided 15 minutes of face-to-face time via video software during this encounter. Additional time was spent in reviewing patient's chart, placing orders etc.   Virgel Manifold, MD  Speech recognition software was used to dictate this note.

## 2019-10-04 NOTE — Patient Instructions (Signed)

## 2019-10-04 NOTE — Telephone Encounter (Signed)
Dr. Garen Lah  Can you please comment on this patient proceeding with colonoscopy under general anesthesia?  You last saw her for aortic dilation and ordered a follow up CT which showed ascending thoracic aorta measuring up to 2.9 cm, proximal descending thoracic aorta measures 2.2 cm, and distal descending thoracic aorta measures 2.1 cm.   Would it be acceptable for her to proceed with procedure?   Thank you  Sharee Pimple

## 2019-10-04 NOTE — Telephone Encounter (Signed)
   [facility] Pre-operative Risk Assessment    Request for surgical clearance:  1. What type of surgery is being performed?  Colonoscopy   2. When is this surgery scheduled? TBD   3. What type of clearance is required (medical clearance vs. Pharmacy clearance to hold med vs. Both)? Medical   4. Are there any medications that need to be held prior to surgery and how long? None listed, please advise if there are any medications to be stopped   5. Practice name and name of physician performing surgery? [facility]   6. What is your office phone number [phone]   7.   What is your office fax number [place] [facility]  8.   Anesthesia type (None, local, MAC, general) ? general   Ace Gins 10/04/2019, 4:28 PM  _________________________________________________________________   (provider comments below)

## 2019-10-04 NOTE — Telephone Encounter (Addendum)
   [facility] Pre-operative Risk Assessment    Request for surgical clearance:  1. What type of surgery is being performed? Colonoscopy   2. When is this surgery scheduled? 10/18/2019   3. Are there any medications that need to be held prior to surgery and how long? None    4. Practice name and name of physician performing surgery? [facility]  Dr. Vonda Antigua  5. What is your office phone and fax number? Phone number [phone] Fax [phone]   6. Anesthesia type (None, local, MAC, general) ? General    Ashley Crosby 10/04/2019, 4:30 PM  _________________________________________________________________   (provider comments below)

## 2019-10-08 ENCOUNTER — Telehealth: Payer: Self-pay

## 2019-10-08 NOTE — Telephone Encounter (Signed)
-----   Message from Kate Sable, MD sent at 10/05/2019 12:52 PM EST ----- Regarding: RE: Clinical Patient can have colonoscopy from a cardiac perspective. She has no cardiac symptoms or history. Only an ectatic descending thoracic aorta. ----- Message ----- From: Shelby Mattocks, CMA Sent: 10/04/2019   4:08 PM EST To: Kate Sable, MD Subject: Clinical

## 2019-10-15 ENCOUNTER — Other Ambulatory Visit: Payer: Self-pay

## 2019-10-15 ENCOUNTER — Other Ambulatory Visit
Admission: RE | Admit: 2019-10-15 | Discharge: 2019-10-15 | Disposition: A | Discharge status: Home / Self Care | Payer: BC Managed Care – PPO | Source: Ambulatory Visit | Attending: Gastroenterology | Admitting: Gastroenterology

## 2019-10-15 DIAGNOSIS — Z20828 Contact with and (suspected) exposure to other viral communicable diseases: Secondary | ICD-10-CM | POA: Insufficient documentation

## 2019-10-15 DIAGNOSIS — Z01812 Encounter for preprocedural laboratory examination: Secondary | ICD-10-CM | POA: Diagnosis present

## 2019-10-15 NOTE — Telephone Encounter (Signed)
   Primary Cardiologist: Kate Sable, MD  Chart reviewed as part of pre-operative protocol coverage. Given past medical history and time since last visit, based on ACC/AHA guidelines, Suhey Buccellato would be at acceptable risk for the planned procedure without further cardiovascular testing.   I will route this recommendation to the requesting party via Epic fax function and remove from pre-op pool.  Please call with questions.  Kerin Ransom, PA-C 10/15/2019, 8:55 AM

## 2019-10-16 LAB — SARS CORONAVIRUS 2 (TAT 6-24 HRS): SARS Coronavirus 2: NEGATIVE

## 2019-10-17 ENCOUNTER — Encounter: Payer: Self-pay | Admitting: Gastroenterology

## 2019-10-18 ENCOUNTER — Ambulatory Visit: Payer: BC Managed Care – PPO | Admitting: Anesthesiology

## 2019-10-18 ENCOUNTER — Other Ambulatory Visit: Payer: Self-pay

## 2019-10-18 ENCOUNTER — Encounter: Payer: Self-pay | Admitting: Gastroenterology

## 2019-10-18 ENCOUNTER — Ambulatory Visit
Admission: RE | Admit: 2019-10-18 | Discharge: 2019-10-18 | Disposition: A | Discharge status: Home / Self Care | Payer: BC Managed Care – PPO | Attending: Gastroenterology | Admitting: Gastroenterology

## 2019-10-18 ENCOUNTER — Encounter
Admission: RE | Disposition: A | Discharge status: Home / Self Care | Payer: Self-pay | Source: Home / Self Care | Attending: Gastroenterology

## 2019-10-18 DIAGNOSIS — K635 Polyp of colon: Secondary | ICD-10-CM

## 2019-10-18 DIAGNOSIS — D123 Benign neoplasm of transverse colon: Secondary | ICD-10-CM | POA: Diagnosis not present

## 2019-10-18 DIAGNOSIS — Z1211 Encounter for screening for malignant neoplasm of colon: Secondary | ICD-10-CM | POA: Diagnosis not present

## 2019-10-18 DIAGNOSIS — Z87891 Personal history of nicotine dependence: Secondary | ICD-10-CM | POA: Insufficient documentation

## 2019-10-18 DIAGNOSIS — D122 Benign neoplasm of ascending colon: Secondary | ICD-10-CM | POA: Diagnosis not present

## 2019-10-18 DIAGNOSIS — D125 Benign neoplasm of sigmoid colon: Secondary | ICD-10-CM | POA: Insufficient documentation

## 2019-10-18 DIAGNOSIS — K5730 Diverticulosis of large intestine without perforation or abscess without bleeding: Secondary | ICD-10-CM | POA: Diagnosis not present

## 2019-10-18 DIAGNOSIS — K6389 Other specified diseases of intestine: Secondary | ICD-10-CM | POA: Diagnosis not present

## 2019-10-18 HISTORY — PX: COLONOSCOPY WITH PROPOFOL: SHX5780

## 2019-10-18 SURGERY — COLONOSCOPY WITH PROPOFOL
Anesthesia: General

## 2019-10-18 MED ORDER — SODIUM CHLORIDE 0.9 % IV SOLN
INTRAVENOUS | Status: DC
  Administered 2019-10-18: 09:00:00 1000 mL via INTRAVENOUS

## 2019-10-18 MED ORDER — PROPOFOL 10 MG/ML IV BOLUS
INTRAVENOUS | Status: DC | PRN
  Administered 2019-10-18: 70 mg via INTRAVENOUS

## 2019-10-18 MED ORDER — PROPOFOL 500 MG/50ML IV EMUL
INTRAVENOUS | Status: DC | PRN
  Administered 2019-10-18: 175 ug/kg/min via INTRAVENOUS

## 2019-10-18 MED ORDER — PHENYLEPHRINE HCL (PRESSORS) 10 MG/ML IV SOLN
INTRAVENOUS | Status: DC | PRN
  Administered 2019-10-18 (×2): 100 ug via INTRAVENOUS

## 2019-10-18 NOTE — Anesthesia Post-op Follow-up Note (Signed)
Anesthesia QCDR form completed.        

## 2019-10-18 NOTE — Transfer of Care (Signed)
Immediate Anesthesia Transfer of Care Note  Patient: Ashley Crosby  Procedure(s) Performed: COLONOSCOPY WITH PROPOFOL (N/A )  Patient Location: PACU  Anesthesia Type:General  Level of Consciousness: awake, alert  and oriented  Airway & Oxygen Therapy: Patient Spontanous Breathing and Patient connected to nasal cannula oxygen  Post-op Assessment: Report given to RN and Post -op Vital signs reviewed and stable  Post vital signs: Reviewed and stable  Last Vitals:  Vitals Value Taken Time  BP 124/99 10/18/19 1010  Temp    Pulse 72 10/18/19 1010  Resp 17 10/18/19 1010  SpO2 85 % 10/18/19 1010  Vitals shown include unvalidated device data.  Last Pain:  Vitals:   10/18/19 1010  TempSrc:   PainSc: 0-No pain         Complications: No apparent anesthesia complications

## 2019-10-18 NOTE — H&P (Signed)
Ashley Antigua, MD [address], [place], [place], [place], [address] [address], [place], [place], [place], [address] Phone: [phone]  Fax: [phone]  Primary Care Physician:  Ranae Plumber, [place]   Pre-Procedure History & Physical: HPI:  Ashley Crosby is a 53 y.o. female is here for a colonoscopy.   History reviewed. No pertinent past medical history.  Past Surgical History:  Procedure Laterality Date  . ABDOMINAL HYSTERECTOMY    . CARPAL TUNNEL RELEASE Right   . foot surgery Right     Prior to Admission medications   Medication Sig Start Date End Date Taking? Authorizing Provider  bisacodyl (BISACODYL) 5 MG EC tablet Take 2 tablets (10mg ) by mouth the day before your procedure between 1pm-3pm. 10/04/19  Yes Virgel Manifold, MD  cyclobenzaprine (FLEXERIL) 5 MG tablet  09/07/19   [provider]    Allergies as of 10/05/2019  . (No Known Allergies)    Family History  Adopted: Yes    Social History   Socioeconomic History  . Marital status: Married    Spouse name: Not on file  . Number of children: Not on file  . Years of education: Not on file  . Highest education level: Not on file  Occupational History  . Not on file  Tobacco Use  . Smoking status: Former Smoker    Years: 30.00    Types: Cigarettes  . Smokeless tobacco: Never Used  Substance and Sexual Activity  . Alcohol use: Not Currently  . Drug use: Never  . Sexual activity: Not on file  Other Topics Concern  . Not on file  Social History Narrative  . Not on file   Social Determinants of Health   Financial Resource Strain:   . Difficulty of Paying Living Expenses: Not on file  Food Insecurity:   . Worried About Charity fundraiser in the Last Year: Not on file  . Ran Out of Food in the Last Year: Not on file  Transportation Needs:   . Lack of Transportation (Medical): Not on file  . Lack of Transportation (Non-Medical): Not on file  Physical Activity:     . Days of Exercise per Week: Not on file  . Minutes of Exercise per Session: Not on file  Stress:   . Feeling of Stress : Not on file  Social Connections:   . Frequency of Communication with Friends and Family: Not on file  . Frequency of Social Gatherings with Friends and Family: Not on file  . Attends Religious Services: Not on file  . Active Member of Clubs or Organizations: Not on file  . Attends Archivist Meetings: Not on file  . Marital Status: Not on file  Intimate Partner Violence:   . Fear of Current or Ex-Partner: Not on file  . Emotionally Abused: Not on file  . Physically Abused: Not on file  . Sexually Abused: Not on file    Review of Systems: See HPI, otherwise negative ROS  Physical Exam: BP 110/82   Pulse 78   Temp 98.1 F (36.7 C) (Temporal)   Resp 16   Ht 5\' 6"  (1.676 m)   Wt 73.6 kg   SpO2 98%   BMI 26.20 kg/m  General:   Alert,  pleasant and cooperative in NAD Head:  Normocephalic and atraumatic. Neck:  Supple; no masses or thyromegaly. Lungs:  Clear throughout to auscultation, normal respiratory effort.    Heart:  +S1, +S2, Regular rate and rhythm, No  edema. Abdomen:  Soft, nontender and nondistended. Normal bowel sounds, without guarding, and without rebound.   Neurologic:  Alert and  oriented x4;  grossly normal neurologically.  Impression/Plan: Ashley Crosby is here for a colonoscopy to be performed for average risk screening.  Risks, benefits, limitations, and alternatives regarding  colonoscopy have been reviewed with the patient.  Questions have been answered.  All parties agreeable.   Virgel Manifold, MD  10/18/2019, 8:55 AM

## 2019-10-18 NOTE — Anesthesia Procedure Notes (Signed)
Date/Time: 10/18/2019 9:21 AM Performed by: Nelda Marseille, CRNA Pre-anesthesia Checklist: Patient identified, Emergency Drugs available, Suction available, Patient being monitored and Timeout performed Oxygen Delivery Method: Nasal cannula

## 2019-10-18 NOTE — Anesthesia Postprocedure Evaluation (Signed)
Anesthesia Post Note  Patient: Ashley Crosby  Procedure(s) Performed: COLONOSCOPY WITH PROPOFOL (N/A )  Patient location during evaluation: Endoscopy Anesthesia Type: General Level of consciousness: awake and alert Pain management: pain level controlled Vital Signs Assessment: post-procedure vital signs reviewed and stable Respiratory status: spontaneous breathing, nonlabored ventilation, respiratory function stable and patient connected to nasal cannula oxygen Cardiovascular status: blood pressure returned to baseline and stable Postop Assessment: no apparent nausea or vomiting Anesthetic complications: no     Last Vitals:  Vitals:   10/18/19 1020 10/18/19 1030  BP: 135/80 (!) 146/92  Pulse: 70 72  Resp: 16 19  Temp:    SpO2: 100% 100%    Last Pain:  Vitals:   10/18/19 1030  TempSrc:   PainSc: 0-No pain                 [name]

## 2019-10-18 NOTE — Op Note (Signed)
[facility] Patient Name: Ashley Crosby Procedure Date: 10/18/2019 9:14 AM MRN: [id number] Account #: [id number] Date of Birth: 1966-01-12 Admit Type: Outpatient Age: 53 Room: [facility] ENDO ROOM 2 Gender: Female Note Status: Finalized Procedure:             Colonoscopy Indications:           Screening for colorectal malignant neoplasm Providers:             Dolora Ridgely B. Bonna Gains MD, MD Medicines:             Monitored Anesthesia Care Complications:         No immediate complications. Procedure:             Pre-Anesthesia Assessment:                        - ASA Grade Assessment: II - A patient with mild                         systemic disease.                        - Prior to the procedure, a History and Physical was                         performed, and patient medications, allergies and                         sensitivities were reviewed. The patient's tolerance                         of previous anesthesia was reviewed.                        - The risks and benefits of the procedure and the                         sedation options and risks were discussed with the                         patient. All questions were answered and informed                         consent was obtained.                        - Patient identification and proposed procedure were                         verified prior to the procedure by the physician, the                         nurse, the anesthesiologist, the anesthetist and the                         technician. The procedure was verified in the                         procedure room.  After obtaining informed consent, the colonoscope was                         passed under direct vision. Throughout the procedure,                         the patient's blood pressure, pulse, and oxygen                         saturations were monitored continuously. The                         Colonoscope  was introduced through the anus and                         advanced to the the terminal ileum. The colonoscopy                         was performed with ease. The patient tolerated the                         procedure well. The quality of the bowel preparation                         was good. Findings:      The perianal and digital rectal examinations were normal.      A 4 mm polyp was found in the ascending colon. The polyp was sessile.       The polyp was removed with a cold biopsy forceps. Resection and       retrieval were complete.      An area of thickened folds of the mucosa was found at the ileocecal       valve. Biopsies were taken with a cold forceps for histology.      A 10 mm polyp was found in the ascending colon. The polyp was flat. Area       was successfully injected with 5 mL saline for lesion assessment, and       this injection appeared to lift the lesion adequately. The polyp was       removed with a hot snare. Resection and retrieval were complete.      A 12 mm polyp was found in the transverse colon. The polyp was flat.       Polypectomy was attempted, initially using a cold snare. Polyp resection       was incomplete with this device. This intervention then required a       different device and polypectomy technique. The polyp was removed with a       cold snare. Resection and retrieval were complete.      Two sessile polyps were found in the sigmoid colon. The polyps were 4 to       5 mm in size. These polyps were removed with a cold snare. Resection and       retrieval were complete.      Multiple diverticula were found in the sigmoid colon.      The exam was otherwise without abnormality.      The retroflexed view of the distal rectum and anal verge was normal and       showed no anal or rectal abnormalities.  The rectum, sigmoid colon, descending colon, transverse colon, ascending       colon and cecum appeared normal. Biopsies for histology were taken  with       a cold forceps from the entire colon for evaluation of microscopic       colitis. Impression:            - One 4 mm polyp in the ascending colon, removed with                         a cold biopsy forceps. Resected and retrieved.                        - Thickened folds of the mucosa at the ileocecal                         valve. Biopsied.                        - One 10 mm polyp in the ascending colon, removed with                         a hot snare. Resected and retrieved. Injected.                        - One 12 mm polyp in the transverse colon, removed                         with a cold snare. Resected and retrieved.                        - Two 4 to 5 mm polyps in the sigmoid colon, removed                         with a cold snare. Resected and retrieved.                        - Diverticulosis in the sigmoid colon.                        - The examination was otherwise normal.                        - The distal rectum and anal verge are normal on                         retroflexion view.                        - The rectum, sigmoid colon, descending colon,                         transverse colon, ascending colon and cecum are                         normal. Biopsied. Recommendation:        - Discharge patient to home (with escort).                        -  Advance diet as tolerated.                        - Continue present medications.                        - Await pathology results.                        - Repeat colonoscopy date to be determined after                         pending pathology results are reviewed.                        - The findings and recommendations were discussed with                         the patient.                        - The findings and recommendations were discussed with                         the patient's family.                        - Return to primary care physician as previously                         scheduled.                         - High fiber diet. Procedure Code(s):     --- Professional ---                        [phone], Colonoscopy, flexible; with removal of                         tumor(s), polyp(s), or other lesion(s) by snare                         technique                        45380, 79, Colonoscopy, flexible; with biopsy, single                         or multiple                        45381, Colonoscopy, flexible; with directed submucosal                         injection(s), any substance Diagnosis Code(s):     --- Professional ---                        K63.5, Polyp of colon                        Z12.11, Encounter for screening for malignant neoplasm  of colon                        K63.89, Other specified diseases of intestine                        K57.30, Diverticulosis of large intestine without                         perforation or abscess without bleeding CPT copyright 2019 American Medical Association. All rights reserved. The codes documented in this report are preliminary and upon coder review may  be revised to meet current compliance requirements.  Vonda Antigua, MD Margretta Sidle B. Bonna Gains MD, MD 10/18/2019 10:17:29 AM This report has been signed electronically. Number of Addenda: 0 Note Initiated On: 10/18/2019 9:14 AM Scope Withdrawal Time: 0 hours 38 minutes 45 seconds  Total Procedure Duration: 0 hours 42 minutes 47 seconds  Estimated Blood Loss:  Estimated blood loss: none.      [facility]

## 2019-10-18 NOTE — Anesthesia Preprocedure Evaluation (Signed)
Anesthesia Evaluation  Patient identified by MRN, date of birth, ID band Patient awake    Reviewed: Allergy & Precautions, NPO status , Patient's Chart, lab work & pertinent test results, reviewed documented beta blocker date and time   Airway Mallampati: II  TM Distance: >3 FB     Dental  (+) Chipped   Pulmonary Patient abstained from smoking., former smoker,           Cardiovascular      Neuro/Psych    GI/Hepatic   Endo/Other    Renal/GU      Musculoskeletal   Abdominal   Peds  Hematology   Anesthesia Other Findings   Reproductive/Obstetrics                             Anesthesia Physical Anesthesia Plan  ASA: II  Anesthesia Plan: General   Post-op Pain Management:    Induction: Intravenous  PONV Risk Score and Plan:   Airway Management Planned:   Additional Equipment:   Intra-op Plan:   Post-operative Plan:   Informed Consent: I have reviewed the patients History and Physical, chart, labs and discussed the procedure including the risks, benefits and alternatives for the proposed anesthesia with the patient or authorized representative who has indicated his/her understanding and acceptance.       Plan Discussed with: CRNA  Anesthesia Plan Comments:         Anesthesia Quick Evaluation

## 2019-10-19 ENCOUNTER — Encounter: Payer: Self-pay | Admitting: *Deleted

## 2019-10-22 ENCOUNTER — Telehealth: Payer: Self-pay | Admitting: Gastroenterology

## 2019-10-22 NOTE — Telephone Encounter (Signed)
Pt left vm to see if her reults are back from her procedure

## 2019-10-22 NOTE — Telephone Encounter (Signed)
Informed patient that we are still waiting on the results for the patient left a detail message. Sent a [employer]

## 2019-10-23 LAB — SURGICAL PATHOLOGY

## 2019-10-24 ENCOUNTER — Encounter: Payer: Self-pay | Admitting: Gastroenterology

## 2021-07-10 IMAGING — MR MR MRCP
5 of 14 series · 17 of 48 positions shown · non-contrast
Comparison: Ultrasound 08/15/2019 and CT abdomen 08/30/2019

CLINICAL DATA: Previous abdominal pain with mildly enlarged common
bile duct on prior ultrasound, for further investigation

EXAM:
MRI ABDOMEN WITHOUT CONTRAST  (INCLUDING MRCP)
TECHNIQUE: Multiplanar multisequence MR imaging of the abdomen was performed.
Heavily T2-weighted images of the biliary and pancreatic ducts were
obtained, and three-dimensional MRCP images were rendered by post
processing.

[Series 3: bSSFP · coronal · 6.0mm · 0.74mm/px · 2 of 30 slices shown]
[im 1/30]
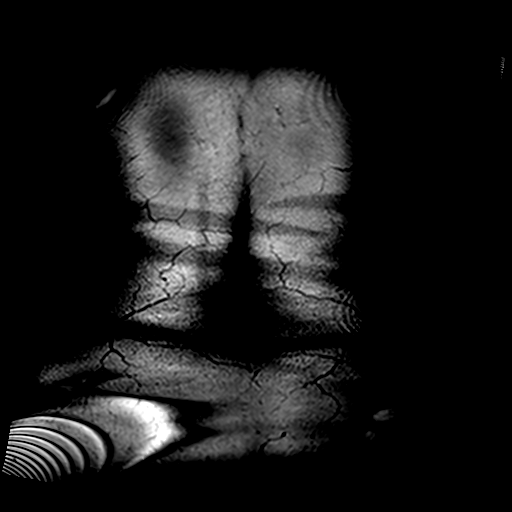
[im 30/30]
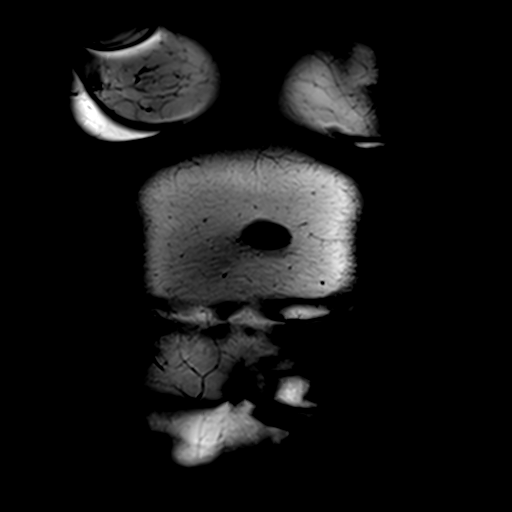

[Series 4: T2 · axial · 6.0mm · 1.19mm/px · z∈[-127,+97]mm · 2 of 32 slices shown]
[im 1/32]
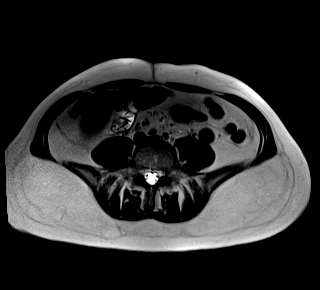
[im 32/32]
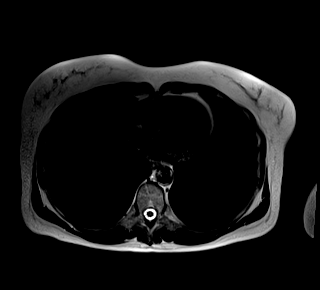

[Series 5: T1 · axial · 6.0mm · 0.74mm/px · z∈[-127,+97]mm · 3 of 32 slices shown (1 of 2)]
[im 1/32]
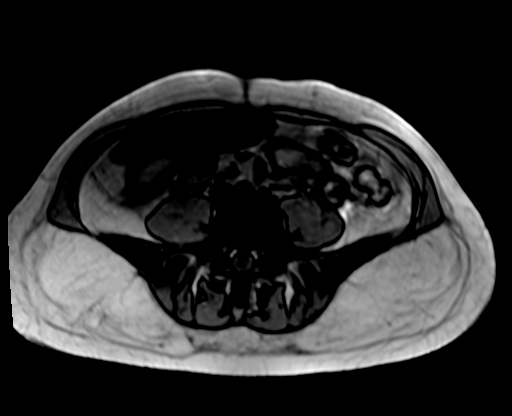
[im 16/32]
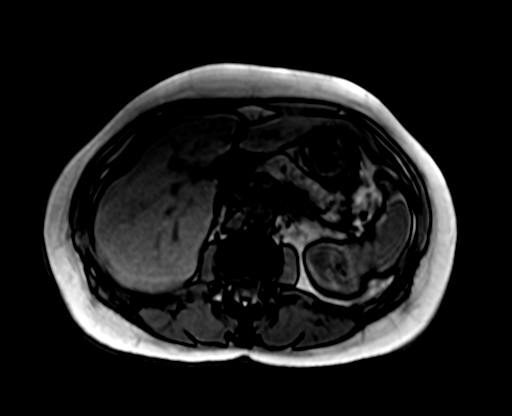
[im 32/32]
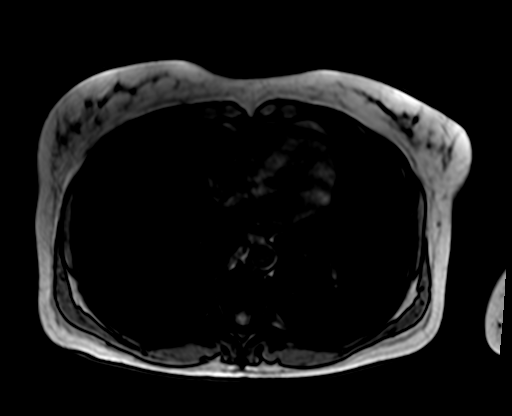

[Series 5: T1 · axial · 6.0mm · 0.74mm/px · z∈[-127,+97]mm · 3 of 32 slices shown (2 of 2)]
[im 1/32]
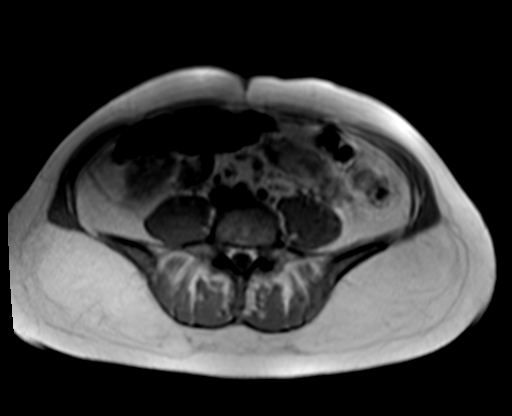
[im 16/32]
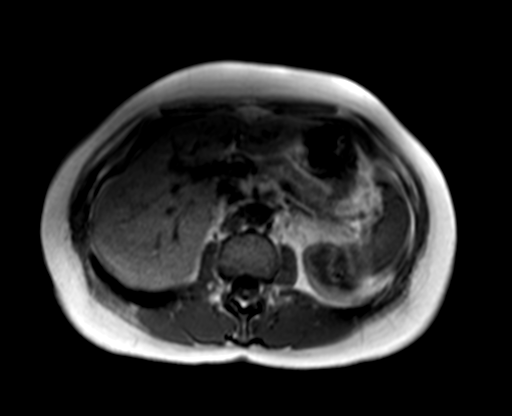
[im 32/32]
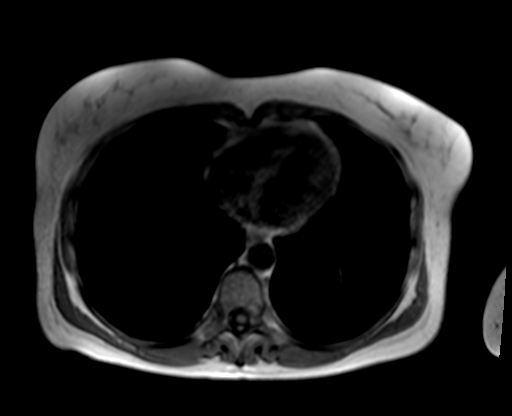

[Series 12: [id number]_iso · coronal · 1.0mm · 0.49mm/px · 7 of 80 slices shown]
[im 1/80]
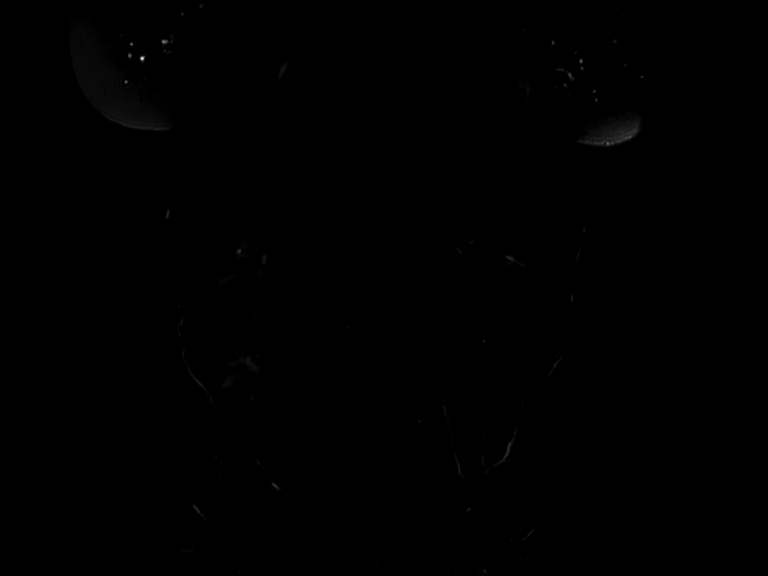
[im 12/80]
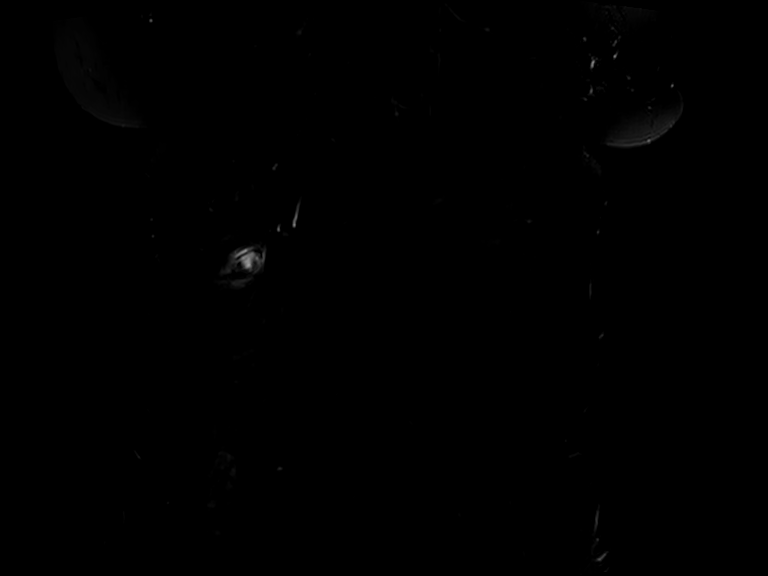
[im 23/80]
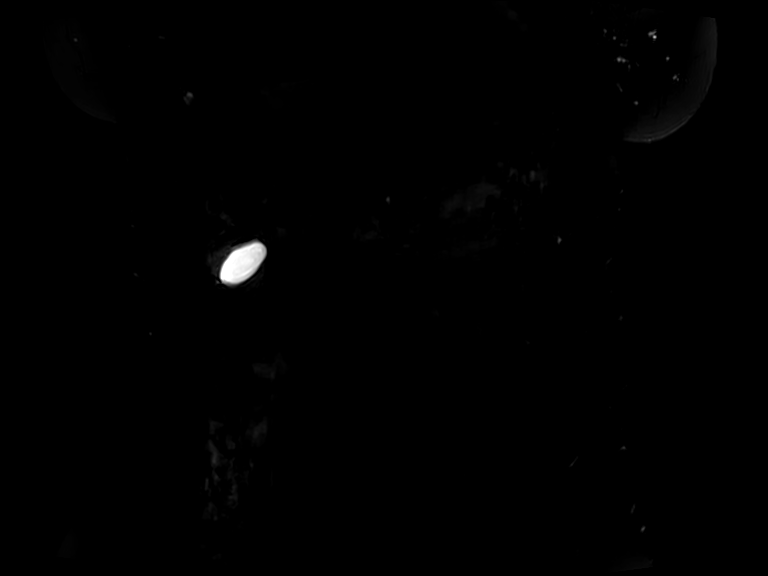
[im 34/80]
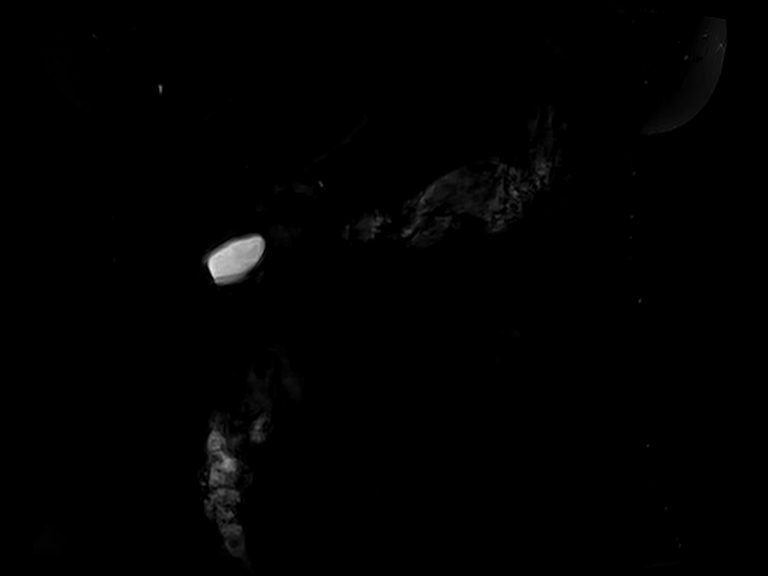
[im 46/80]
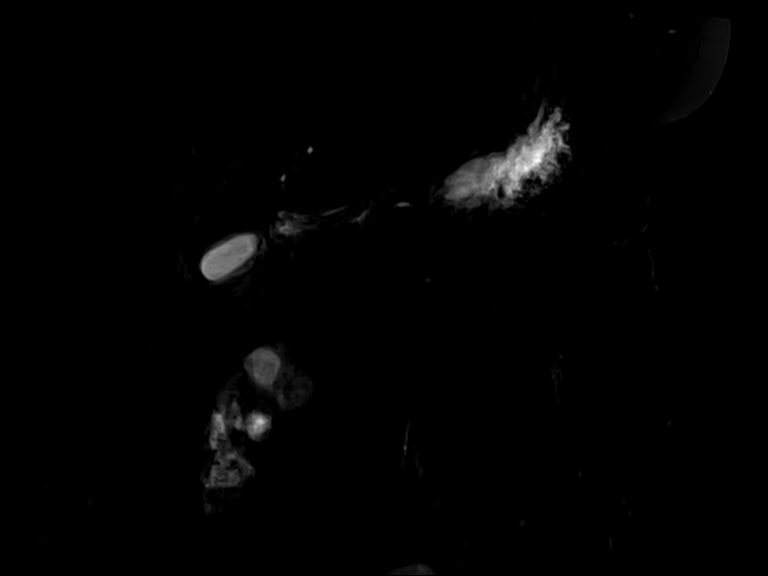
[im 57/80]
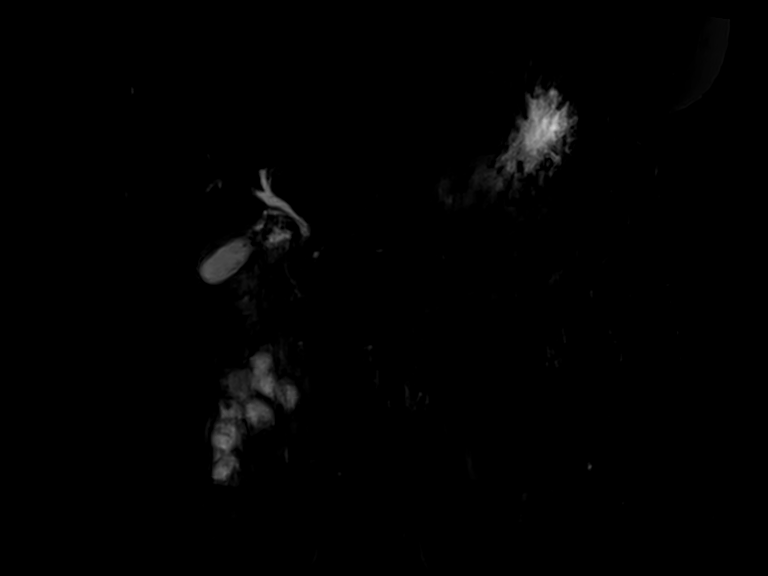
[im 68/80]
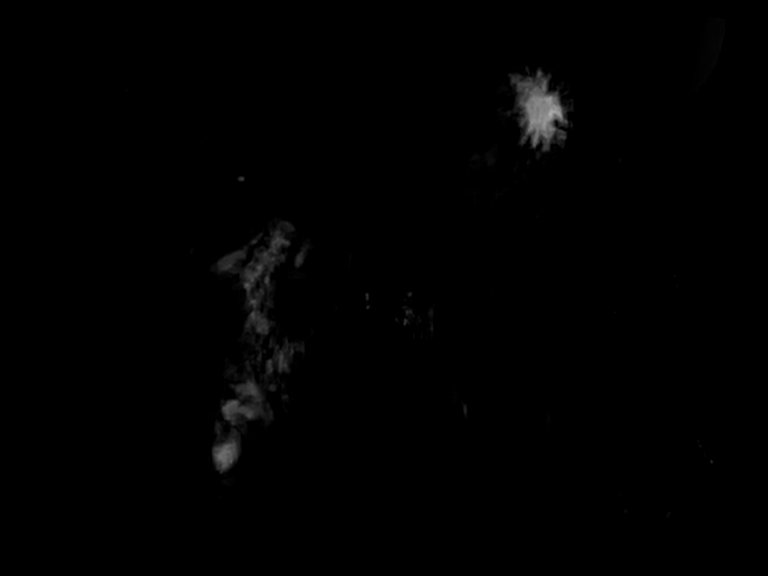

[17 of 48 positions shown; findings below may reference images not displayed]

FINDINGS: Lower chest: Unremarkable

Hepatobiliary: 0.9 by 0.6 cm fluid signal intensity lesion
posteriorly in the right hepatic lobe on image [DATE], stable from
08/30/2019 and most compatible with a cyst.

No current intrahepatic or extrahepatic biliary dilatation.
Currently the common bile duct measures up to 0.4 cm in diameter. No
abnormal filling defect in the gallbladder. No significant abnormal
restriction of diffusion in the liver.

Pancreas:  Unremarkable

Spleen:  Unremarkable

Adrenals/Urinary Tract: Sharply defined homogeneous fluid signal
intensity lesion of the left kidney lower pole measures 1.2 by
cm, noncontrast MRI appearance favors a cyst. Adrenal glands
unremarkable.

Stomach/Bowel: Unremarkable

Vascular/Lymphatic:  Aortoiliac atherosclerotic vascular disease.

Other:  No supplemental non-categorized findings.

Musculoskeletal: Unremarkable
IMPRESSION: 1. No current biliary dilatation or biliary abnormality is evident
on noncontrast MRCP.
2. Small hepatic cyst and small left renal cyst.
3.  Aortic Atherosclerosis ([id number]).
